# Patient Record
Sex: Male | Born: 1995 | Race: White | Hispanic: No | Marital: Single | Smoking: Never smoker
Health system: Southern US, Community
[De-identification: ages and names within clinical notes are randomized; demographics above are authoritative.]

## PROBLEM LIST (undated history)

## (undated) DIAGNOSIS — J302 Other seasonal allergic rhinitis: Secondary | ICD-10-CM

## (undated) DIAGNOSIS — R42 Dizziness and giddiness: Secondary | ICD-10-CM

## (undated) DIAGNOSIS — F988 Other specified behavioral and emotional disorders with onset usually occurring in childhood and adolescence: Secondary | ICD-10-CM

## (undated) DIAGNOSIS — I7300 Raynaud's syndrome without gangrene: Secondary | ICD-10-CM

## (undated) DIAGNOSIS — G8929 Other chronic pain: Secondary | ICD-10-CM

## (undated) DIAGNOSIS — M549 Dorsalgia, unspecified: Secondary | ICD-10-CM

---
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
## 2014-04-29 ENCOUNTER — Other Ambulatory Visit: Payer: 59 | Admitting: Sports Medicine

## 2014-05-08 IMAGING — CT CT ABD-PELV W/ CM
2 of 4 series · 16 of 46 positions shown, 18 images · IV contrast (Omnipaque 300)
Comparison: None.

CLINICAL DATA: Periumbilical and right lower quadrant pain for 1
day.  Nausea and vomiting.

CT ABDOMEN AND PELVIS WITH CONTRAST
TECHNIQUE: Multidetector CT imaging of the abdomen and pelvis was
performed following the standard protocol during bolus
administration of intravenous contrast.
Contrast: 100mL OMNIPAQUE IOHEXOL 300 MG/ML  SOLN

[Series 2: abd_pel_with 5.0 b40f · axial · 0.66mm/px · z∈[-534,-74]mm · 13 of 100 slices shown, 15 images]
[im 4/100  soft-tissue]
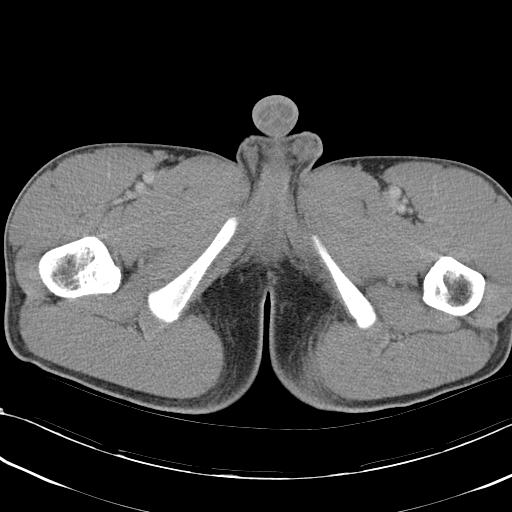
[im 4/100  bone]
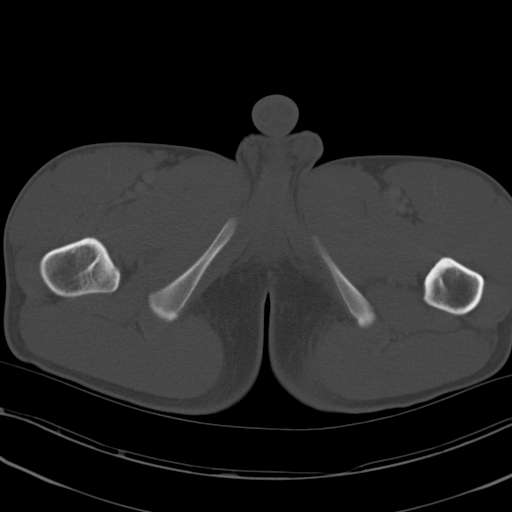
[im 12/100  soft-tissue]
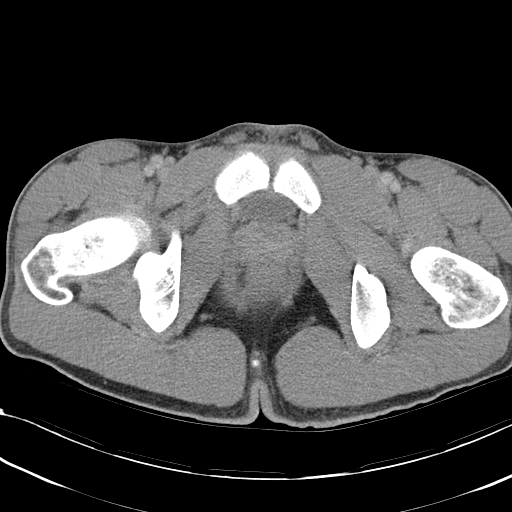
[im 20/100  soft-tissue]
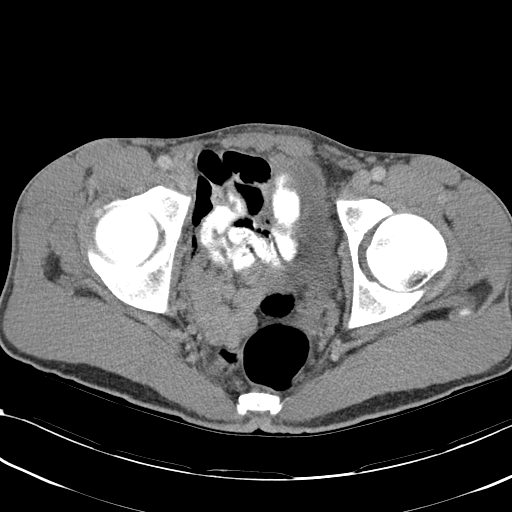
[im 27/100  soft-tissue]
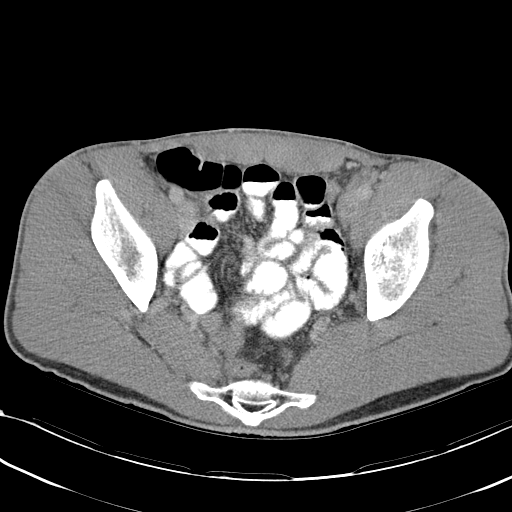
[im 35/100  soft-tissue]
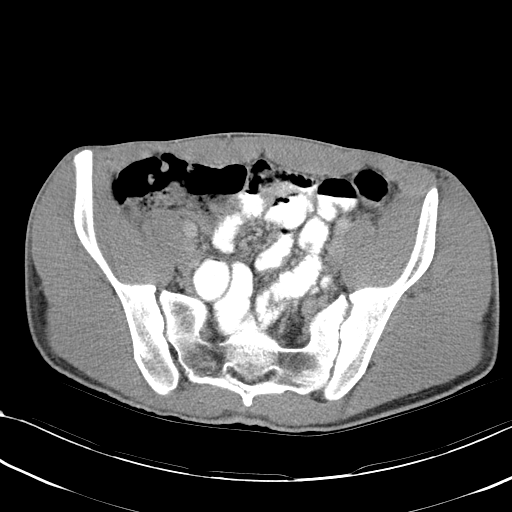
[im 42/100  soft-tissue]
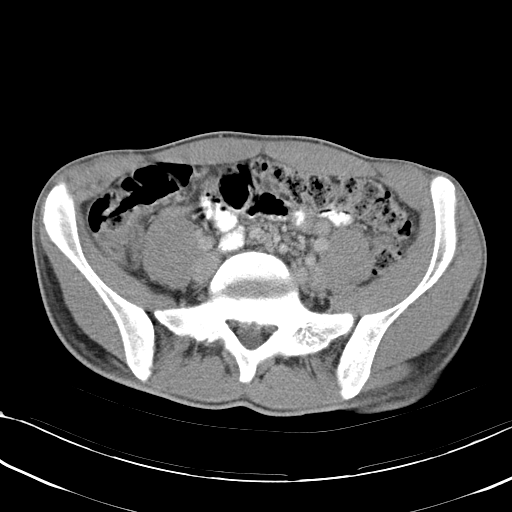
[im 50/100  soft-tissue]
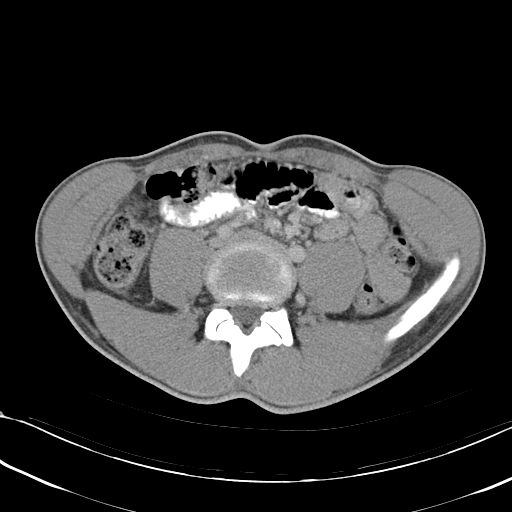
[im 58/100  soft-tissue]
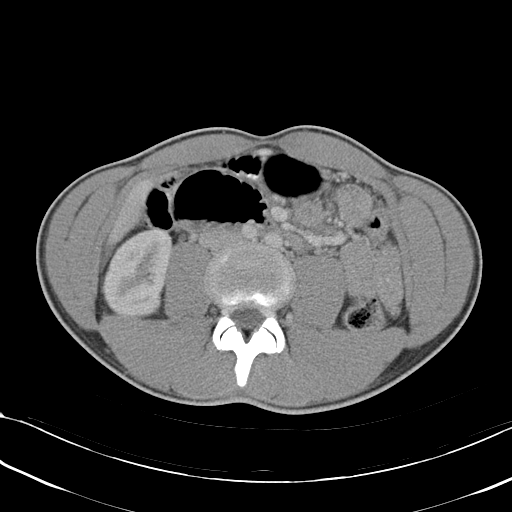
[im 65/100  soft-tissue]
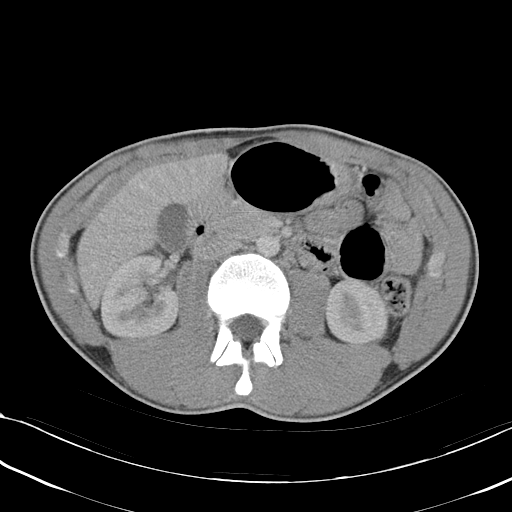
[im 65/100  bone]
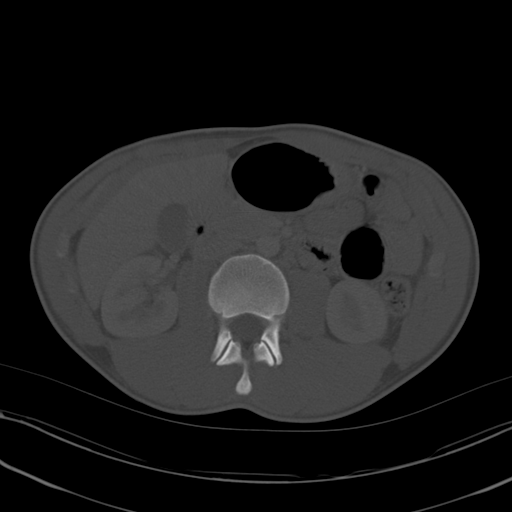
[im 73/100  soft-tissue]
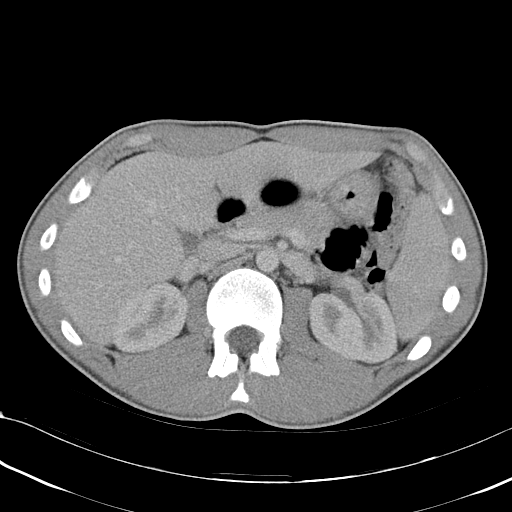
[im 80/100  soft-tissue]
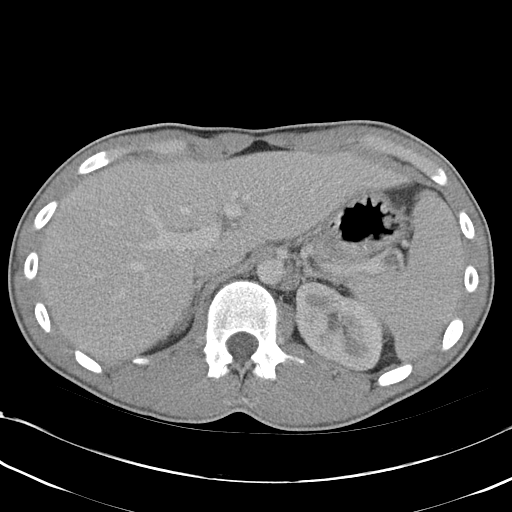
[im 88/100  soft-tissue]
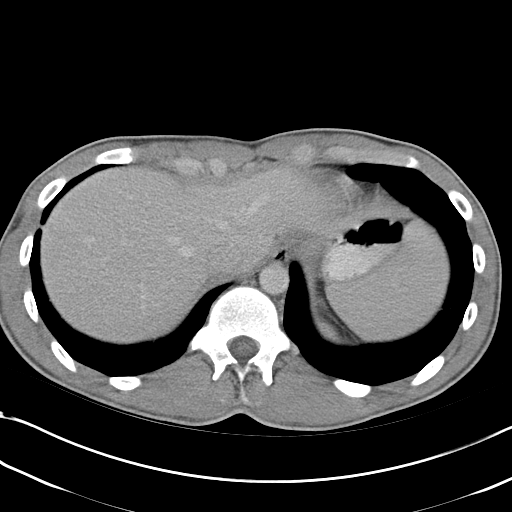
[im 96/100  soft-tissue]
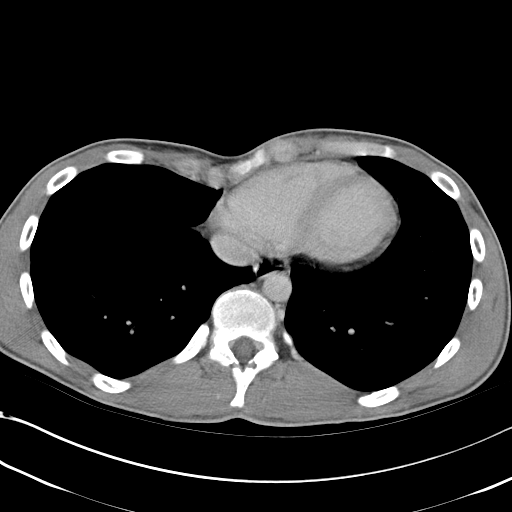

[Series 4: abd_pel_with 3.0 spo · coronal · 0.68mm/px · 3 of 68 slices shown]
[im 23/68  soft-tissue]
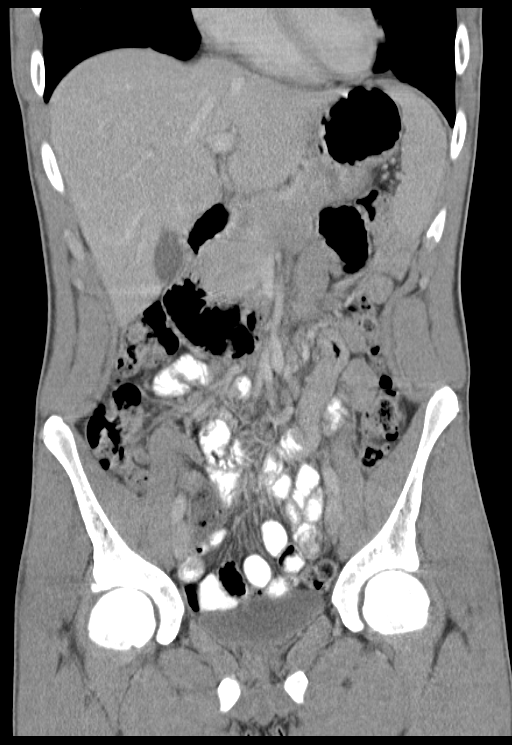
[im 30/68  soft-tissue]
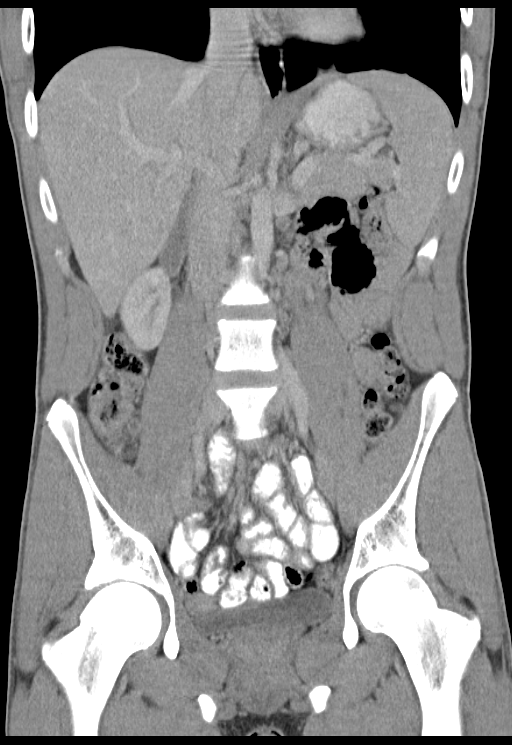
[im 38/68  soft-tissue]
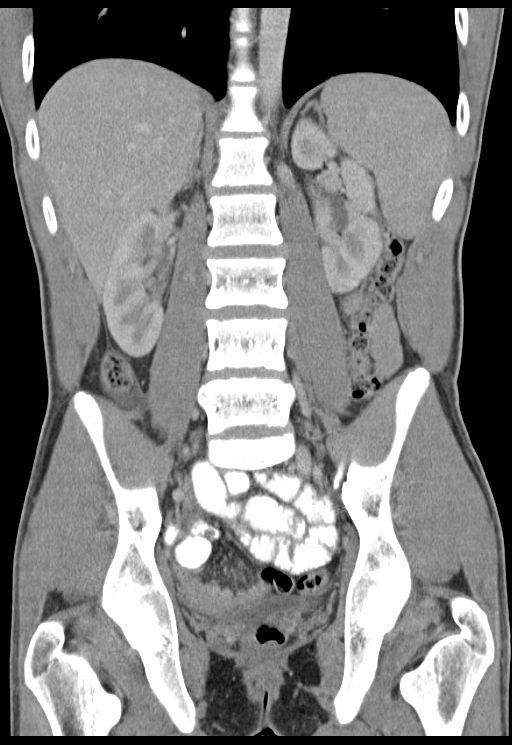

[16 of 46 positions shown; findings below may reference images not displayed]

FINDINGS: Lung bases:  Minimal atelectasis / volume loss at the
left lung base. Normal heart size without pericardial or pleural
effusion.

Abdomen/pelvis:  Normal liver, spleen.  The proximal transverse
duodenum is mildly prominent, but no cause is seen and there is no
significant gastric distention identified.

Normal pancreas, gallbladder, biliary tract, adrenal glands, right
kidney.  A too small to characterize interpolar left renal lesion
is likely a cyst. No retroperitoneal or retrocrural adenopathy.

Normal colon.  The terminal ileum is difficult to visualize but
likely unremarkable.  The appendix is mildly dilated and inflamed,
including on transverse image 57, sagittal image 30, and coronal
image 35.  There is minimal fluid tracking in the right pericolic
gutter, including on image 45/series 2.  No free perforation or
periappendiceal abscess.

Normal small bowel.

No pelvic adenopathy.  Normal urinary bladder and prostate.

Bones/Musculoskeletal:  No acute osseous abnormality.
IMPRESSION: Findings of acute uncomplicated appendicitis.

These results were called by telephone on 05/22/2012 at 2353 hours
to Dr. Lubin, who verbally acknowledged these results.

## 2015-02-23 MED FILL — TAMIFLU 75 MG GELCAP: 75 | 10 days supply | Qty: 10 | Fill #0

## 2015-06-29 DIAGNOSIS — R112 Nausea with vomiting, unspecified: Secondary | ICD-10-CM | POA: Diagnosis not present

## 2015-06-29 DIAGNOSIS — R197 Diarrhea, unspecified: Secondary | ICD-10-CM | POA: Diagnosis not present

## 2015-09-07 ENCOUNTER — Ambulatory Visit (INDEPENDENT_AMBULATORY_CARE_PROVIDER_SITE_OTHER): Payer: 59 | Admitting: Sports Medicine

## 2015-09-07 VITALS — BP 114/82 | Ht 77.0 in | Wt 200.0 lb

## 2015-09-07 DIAGNOSIS — S83003A Unspecified subluxation of unspecified patella, initial encounter: Secondary | ICD-10-CM | POA: Insufficient documentation

## 2015-09-07 DIAGNOSIS — M25562 Pain in left knee: Secondary | ICD-10-CM | POA: Diagnosis not present

## 2015-09-07 DIAGNOSIS — S83002A Unspecified subluxation of left patella, initial encounter: Secondary | ICD-10-CM

## 2015-09-07 NOTE — Assessment & Plan Note (Addendum)
Problems with subluxations and dislocations occurring in his left knee over the past year. Discussed that surgery would not be the most beneficial at this time. I think his wrap on knee stabilizer is not adequate for good control  He has some atrophy of his left quadricep noticed on measurements - Provided Genutrain P3 brace today - Encouraged strengthening exercises of his abductors and medial quad - Advised to follow-up when necessary. If has recurrence of patellar dislocation laterally then may need to consider surgery at that point.

## 2015-09-07 NOTE — Progress Notes (Signed)
  Darryl MuirCaleb Gardner - 20 y.o. male MRN [id number]  Date of birth: [birth year]  SUBJECTIVE:  Including CC & ROS.  Chief Complaint  Patient presents with  . Knee Pain   Left patellar dislocations:  He reports that he has had a patellar "dislocations" in his left knee 4 times over the past year. He reports the first time was about a year ago and it occurred after he was landing playing basketball The patellar dislocated laterally. The third time it dislocated it was significant to where his patella was completely on the side of his leg. At that time he had an MRI which showed grade 2 anterior cruciate ligament and MCL strain. He underwent physical therapy and that occurred in November and December 2016. His most recent subluxation occurred about a month ago where he was walking to class and it was wanting to translate laterally. Denies any pain or swelling. Denies any numbness, tingling, or weakness. It exacerbates if he is performing a squat or lying on his stomach.  ROS: No unexpected weight loss, fever, chills, swelling, instability, muscle pain, numbness/tingling, redness, otherwise see HPI    HISTORY: Past Medical, Surgical, Social, and Family History Reviewed & Updated per EMR.   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  Psych:  alert and oriented Knee: Normal to inspection with no erythema or effusion or obvious bony abnormalities. Palpation normal  with no warmth, joint line tenderness, patellar tenderness, or condyle tenderness. ROM full in flexion and extension and lower leg rotation. Ligaments with solid consistent endpoints including ACL, LCL, MCL. Negative Mcmurray's and Thessalonian tests. Non painful patellar compression. Patellar glide without crepitus. Patellar and quadriceps tendons unremarkable. Hamstring and quadriceps strength is normal.  Normal gait.  No Trendelenburg Neurovascularly intact Measurement of left quadricep: 16 in Measurement of left gastrocnemius: 14.5 inches  ASSESSMENT & PLAN:   Left knee pain Problems with subluxations and dislocations occurring in his left knee over the past year. Discussed that surgery would not be the most beneficial at this time. He has some atrophy of his left quadricep noticed on measurements - Provided brace today - Encouraged strengthening exercises of his abductors and medial quad - Advised to follow-up when necessary. If has recurrence of patellar dislocation laterally then may need to consider surgery at that point.

## 2015-09-07 NOTE — Patient Instructions (Signed)
Thank you for coming in,   Please try these different exercises:  1. Squeeze a ball between your knees while straightening your left leg.  2. You can perform one leg squats and turn your left foot out while you are performing them.     Please feel free to call with any questions or concerns at any time, at [phone]. --Dr. Jordan LikesSchmitz

## 2016-01-11 DIAGNOSIS — M542 Cervicalgia: Secondary | ICD-10-CM | POA: Diagnosis not present

## 2016-01-11 MED FILL — CYCLOBENZAPRINE 5 MG TABLET: 5 | 15 days supply | Qty: 45 | Fill #0

## 2016-01-31 MED FILL — CYCLOBENZAPRINE 5 MG TABLET: 5 | 15 days supply | Qty: 45 | Fill #0

## 2016-03-12 MED FILL — CYCLOBENZAPRINE 5 MG TABLET: 5 | 15 days supply | Qty: 45 | Fill #1

## 2016-04-17 MED FILL — CYCLOBENZAPRINE 5 MG TABLET: 5 | 15 days supply | Qty: 45 | Fill #2

## 2016-09-19 MED FILL — CYCLOBENZAPRINE 5 MG TABLET: 5 | 15 days supply | Qty: 45 | Fill #0

## 2016-10-05 MED FILL — CYCLOBENZAPRINE 5 MG TABLET: 5 | 15 days supply | Qty: 45 | Fill #1

## 2017-01-28 DIAGNOSIS — J029 Acute pharyngitis, unspecified: Secondary | ICD-10-CM | POA: Diagnosis not present

## 2017-01-28 DIAGNOSIS — R5081 Fever presenting with conditions classified elsewhere: Secondary | ICD-10-CM | POA: Diagnosis not present

## 2017-01-28 DIAGNOSIS — J209 Acute bronchitis, unspecified: Secondary | ICD-10-CM | POA: Diagnosis not present

## 2017-01-28 DIAGNOSIS — J069 Acute upper respiratory infection, unspecified: Secondary | ICD-10-CM | POA: Diagnosis not present

## 2017-01-28 DIAGNOSIS — R509 Fever, unspecified: Secondary | ICD-10-CM | POA: Diagnosis not present

## 2017-02-05 DIAGNOSIS — J069 Acute upper respiratory infection, unspecified: Secondary | ICD-10-CM | POA: Diagnosis not present

## 2017-02-05 MED FILL — predniSONE 10 MG TABS: 10 | 5 days supply | Qty: 21 | Fill #0

## 2017-02-05 MED FILL — levoFLOXacin 750 MG TABS: 750 | 7 days supply | Qty: 7 | Fill #0

## 2017-03-29 DIAGNOSIS — M542 Cervicalgia: Secondary | ICD-10-CM | POA: Diagnosis not present

## 2017-03-29 DIAGNOSIS — M546 Pain in thoracic spine: Secondary | ICD-10-CM | POA: Diagnosis not present

## 2017-03-29 MED FILL — CYCLOBENZAPRINE 5 MG TABLET: 5 | 20 days supply | Qty: 20 | Fill #0

## 2017-04-23 MED FILL — CYCLOBENZAPRINE 5 MG TABLET: 5 | 20 days supply | Qty: 20 | Fill #0

## 2017-05-09 MED FILL — CYCLOBENZAPRINE 5 MG TABLET: 5 | 20 days supply | Qty: 20 | Fill #1

## 2024-03-02 ENCOUNTER — Institutional Professional Consult (permissible substitution) (INDEPENDENT_AMBULATORY_CARE_PROVIDER_SITE_OTHER)
